# Patient Record
Sex: Male | Born: 1965 | Race: White | Hispanic: No | Marital: Married | Smoking: Never smoker
Health system: Southern US, Community
[De-identification: ages and names within clinical notes are randomized; demographics above are authoritative.]

## PROBLEM LIST (undated history)

## (undated) DIAGNOSIS — M549 Dorsalgia, unspecified: Secondary | ICD-10-CM

---

## 2009-11-13 ENCOUNTER — Ambulatory Visit: Payer: Self-pay | Admitting: Internal Medicine

## 2010-09-04 ENCOUNTER — Ambulatory Visit: Payer: Self-pay | Admitting: Internal Medicine

## 2015-08-15 ENCOUNTER — Ambulatory Visit: Payer: BC Managed Care – PPO | Admitting: Anesthesiology

## 2015-08-15 ENCOUNTER — Encounter
Admission: RE | Disposition: A | Discharge status: Home / Self Care | Payer: Self-pay | Source: Ambulatory Visit | Attending: Gastroenterology

## 2015-08-15 ENCOUNTER — Ambulatory Visit
Admission: RE | Admit: 2015-08-15 | Discharge: 2015-08-15 | Disposition: A | Discharge status: Home / Self Care | Payer: BC Managed Care – PPO | Source: Ambulatory Visit | Attending: Gastroenterology | Admitting: Gastroenterology

## 2015-08-15 ENCOUNTER — Encounter: Payer: Self-pay | Admitting: Anesthesiology

## 2015-08-15 DIAGNOSIS — D125 Benign neoplasm of sigmoid colon: Secondary | ICD-10-CM | POA: Insufficient documentation

## 2015-08-15 DIAGNOSIS — D124 Benign neoplasm of descending colon: Secondary | ICD-10-CM | POA: Insufficient documentation

## 2015-08-15 DIAGNOSIS — Z1211 Encounter for screening for malignant neoplasm of colon: Secondary | ICD-10-CM | POA: Insufficient documentation

## 2015-08-15 HISTORY — PX: COLONOSCOPY WITH PROPOFOL: SHX5780

## 2015-08-15 SURGERY — COLONOSCOPY WITH PROPOFOL
Anesthesia: General

## 2015-08-15 MED ORDER — SODIUM CHLORIDE 0.9 % IV SOLN: INTRAVENOUS | Status: DC

## 2015-08-15 MED ORDER — MIDAZOLAM HCL 5 MG/5ML IJ SOLN
INTRAMUSCULAR | Status: DC | PRN
  Administered 2015-08-15: 2 mg via INTRAVENOUS

## 2015-08-15 MED ORDER — SODIUM CHLORIDE 0.9 % IV SOLN
INTRAVENOUS | Status: DC
  Administered 2015-08-15: 1000 mL via INTRAVENOUS
  Administered 2015-08-15: 10:00:00 via INTRAVENOUS

## 2015-08-15 MED ORDER — PROPOFOL 500 MG/50ML IV EMUL
INTRAVENOUS | Status: DC | PRN
Start: 2015-08-15 — End: 2015-08-15
  Administered 2015-08-15: 160 ug/kg/min via INTRAVENOUS

## 2015-08-15 MED ORDER — PROPOFOL 10 MG/ML IV BOLUS
INTRAVENOUS | Status: DC | PRN
  Administered 2015-08-15: 70 mg via INTRAVENOUS

## 2015-08-15 NOTE — H&P (Signed)
Outpatient short stay form Pre-procedure 08/15/2015 10:14 AM Lollie Sails MD  Primary Physician: Dr. Emily Filbert  Reason for visit:  Screening colonoscopy  History of present illness:  Patient is a 50 year old male presenting for his initial screening colonoscopy. He tolerated his prep well. He takes no blood thinners or aspirin products.    Current facility-administered medications:  .  0.9 %  sodium chloride infusion, , Intravenous, Continuous, Lollie Sails, MD, Last Rate: 20 mL/hr at 08/15/15 0917, 1,000 mL at 08/15/15 0917 .  0.9 %  sodium chloride infusion, , Intravenous, Continuous, Lollie Sails, MD  No prescriptions prior to admission     Not on File   History reviewed. No pertinent past medical history.  Review of systems:      Physical Exam    Heart and lungs: Regular rate and rhythm without rub or gallop, lungs are bilaterally clear.    HEENT: Normocephalic atraumatic eyes are anicteric    Other:     Pertinant exam for procedure: Soft nontender nondistended bowel sounds positive normoactive.    Planned proceedures: Colonoscopy and indicated procedures. I have discussed the risks benefits and complications of procedures to include not limited to bleeding, infection, perforation and the risk of sedation and the patient wishes to proceed.    Lollie Sails, MD Gastroenterology 08/15/2015  10:14 AM

## 2015-08-15 NOTE — Anesthesia Procedure Notes (Signed)
Date/Time: 08/15/2015 10:15 AM Performed by: Allean Found Pre-anesthesia Checklist: Patient identified, Emergency Drugs available, Suction available, Patient being monitored and Timeout performed Patient Re-evaluated:Patient Re-evaluated prior to inductionOxygen Delivery Method: Nasal cannula Intubation Type: IV induction

## 2015-08-15 NOTE — Anesthesia Preprocedure Evaluation (Signed)
Anesthesia Evaluation  Patient identified by MRN, date of birth, ID band Patient awake    Reviewed: Allergy & Precautions, H&P , NPO status , Patient's Chart, lab work & pertinent test results, reviewed documented beta blocker date and time   History of Anesthesia Complications Negative for: history of anesthetic complications  Airway Mallampati: I  TM Distance: >3 FB Neck ROM: full    Dental no notable dental hx. (+) Caps   Pulmonary neg pulmonary ROS,    Pulmonary exam normal breath sounds clear to auscultation       Cardiovascular Exercise Tolerance: Good negative cardio ROS Normal cardiovascular exam Rhythm:regular Rate:Normal     Neuro/Psych negative neurological ROS  negative psych ROS   GI/Hepatic negative GI ROS, Neg liver ROS,   Endo/Other  negative endocrine ROS  Renal/GU negative Renal ROS  negative genitourinary   Musculoskeletal   Abdominal   Peds  Hematology negative hematology ROS (+)   Anesthesia Other Findings History reviewed. No pertinent past medical history.   Reproductive/Obstetrics negative OB ROS                             Anesthesia Physical Anesthesia Plan  ASA: I  Anesthesia Plan: General   Post-op Pain Management:    Induction:   Airway Management Planned:   Additional Equipment:   Intra-op Plan:   Post-operative Plan:   Informed Consent: I have reviewed the patients History and Physical, chart, labs and discussed the procedure including the risks, benefits and alternatives for the proposed anesthesia with the patient or authorized representative who has indicated his/her understanding and acceptance.   Dental Advisory Given  Plan Discussed with: Anesthesiologist, CRNA and Surgeon  Anesthesia Plan Comments:         Anesthesia Quick Evaluation

## 2015-08-15 NOTE — Op Note (Signed)
[facility] Gastroenterology Patient Name: John Lucas Procedure Date: 08/15/2015 10:20 AM MRN: [id number] Account #: [id number] Date of Birth: [birth year] Admit Type: Outpatient Age: 50 Room: [facility] ENDO ROOM 3 Gender: Male Note Status: Finalized Procedure:         Colonoscopy Indications:       Screening for colorectal malignant neoplasm, This is the                     patient's first colonoscopy Providers:         Lollie Sails, MD Referring MD:      Rusty Aus, MD (Referring MD) Medicines:         Monitored Anesthesia Care Complications:     No immediate complications. Procedure:         Pre-Anesthesia Assessment:                    - ASA Grade Assessment: I - A normal, healthy patient.                    After obtaining informed consent, the colonoscope was                     passed under direct vision. Throughout the procedure, the                     patient's blood pressure, pulse, and oxygen saturations                     were monitored continuously. The Colonoscope was                     introduced through the anus and advanced to the the cecum,                     identified by appendiceal orifice and ileocecal valve. The                     colonoscopy was performed without difficulty. The patient                     tolerated the procedure well. The quality of the bowel                     preparation was good. Findings:      Two sessile polyps were found in the descending colon. The polyps were 2       to 3 mm in size. [Method]. [Resection & Retrieval]. These polyps were       removed with a cold biopsy forceps. Resection and retrieval were       complete.      A 4 mm polyp was found in the proximal sigmoid colon. The polyp was       sessile. The polyp was removed with a cold snare. Resection and       retrieval were complete.      The retroflexed view of the distal rectum and anal verge was normal and       showed no anal or rectal  abnormalities.      The digital rectal exam was normal. Impression:        - Two 2 to 3 mm polyps in the descending colon. Resected  and retrieved.                    - One 4 mm polyp in the proximal sigmoid colon. Resected                     and retrieved.                    - The distal rectum and anal verge are normal on                     retroflexion view. Recommendation:    - Discharge patient to home. Procedure Code(s): --- Professional ---                    [phone], Colonoscopy, flexible; with removal of tumor(s),                     polyp(s), or other lesion(s) by snare technique                    45380, 54, Colonoscopy, flexible; with biopsy, single or                     multiple Diagnosis Code(s): --- Professional ---                    Z12.11, Encounter for screening for malignant neoplasm of                     colon                    D12.4, Benign neoplasm of descending colon                    D12.5, Benign neoplasm of sigmoid colon CPT copyright 2014 American Medical Association. All rights reserved. The codes documented in this report are preliminary and upon coder review may  be revised to meet current compliance requirements. Lollie Sails, MD 08/15/2015 10:46:06 AM This report has been signed electronically. Number of Addenda: 0 Note Initiated On: 08/15/2015 10:20 AM Scope Withdrawal Time: 0 hours 10 minutes 55 seconds  Total Procedure Duration: 0 hours 16 minutes 7 seconds       [facility]

## 2015-08-15 NOTE — Anesthesia Postprocedure Evaluation (Signed)
Anesthesia Post Note  Patient: John Lucas  Procedure(s) Performed: Procedure(s) (LRB): COLONOSCOPY WITH PROPOFOL (N/A)  Patient location during evaluation: Endoscopy Anesthesia Type: General Level of consciousness: awake and alert Pain management: pain level controlled Vital Signs Assessment: post-procedure vital signs reviewed and stable Respiratory status: spontaneous breathing, nonlabored ventilation, respiratory function stable and patient connected to nasal cannula oxygen Cardiovascular status: blood pressure returned to baseline and stable Postop Assessment: no signs of nausea or vomiting Anesthetic complications: no    Last Vitals:  Filed Vitals:   08/15/15 1110 08/15/15 1120  BP: 119/73 121/84  Pulse: 59 58  Temp:    Resp: 12 13    Last Pain: There were no vitals filed for this visit.               Martha Clan

## 2015-08-15 NOTE — Transfer of Care (Signed)
Immediate Anesthesia Transfer of Care Note  Patient: John Lucas  Procedure(s) Performed: Procedure(s): COLONOSCOPY WITH PROPOFOL (N/A)  Patient Location: PACU  Anesthesia Type:General  Level of Consciousness: sedated  Airway & Oxygen Therapy: Patient Spontanous Breathing and Patient connected to nasal cannula oxygen  Post-op Assessment: Report given to RN and Post -op Vital signs reviewed and stable  Post vital signs: Reviewed and stable  Last Vitals:  Filed Vitals:   08/15/15 0905  BP: 138/87  Pulse: 66  Temp: 36.4 C  Resp: 16    Complications: No apparent anesthesia complications

## 2015-08-18 LAB — SURGICAL PATHOLOGY

## 2016-03-17 ENCOUNTER — Ambulatory Visit
Admission: EM | Admit: 2016-03-17 | Discharge: 2016-03-17 | Disposition: A | Discharge status: Home / Self Care | Payer: BC Managed Care – PPO | Attending: Family Medicine | Admitting: Family Medicine

## 2016-03-17 ENCOUNTER — Encounter: Payer: Self-pay | Admitting: *Deleted

## 2016-03-17 DIAGNOSIS — M545 Low back pain, unspecified: Secondary | ICD-10-CM

## 2016-03-17 MED ORDER — NAPROXEN 500 MG PO TABS: 500.0000 mg | ORAL_TABLET | Freq: Two times a day (BID) | ORAL | 0 refills | Status: DC

## 2016-03-17 MED ORDER — CYCLOBENZAPRINE HCL 5 MG PO TABS: ORAL_TABLET | ORAL | 0 refills | Status: DC

## 2016-03-17 MED ORDER — KETOROLAC TROMETHAMINE 60 MG/2ML IM SOLN
60.0000 mg | Freq: Once | INTRAMUSCULAR | Status: AC
  Administered 2016-03-17: 60 mg via INTRAMUSCULAR

## 2016-03-17 NOTE — Discharge Instructions (Signed)
Medications prescribed, ice/heat when necessary, if symptoms persist or worsen seek medical attention. Avoid activities that cause increased discomfort.

## 2016-03-17 NOTE — ED Triage Notes (Signed)
Low back pain x2 days. Denies injury. Recurrent problem.

## 2016-03-17 NOTE — ED Provider Notes (Signed)
[facility]    CSN: [id number] Arrival date & time: 03/17/16  1508  First Provider Contact:  None       History   Chief Complaint Chief Complaint  Patient presents with  . Back Pain    HPI John Lucas is a 50 y.o. male.   HPI: Patient presents today with symptoms of right-sided lower back pain. Patient states that he stepped in a hole while he was walking in the woods on Monday when the symptoms started. He feels like his back is spasming. He denies any other injury to his back or falling. He denies any radicular symptoms, incontinence, fever/chills, urinary symptoms. He has had lower back pain in the past but has never been diagnosed with any issues. The pain would just go away with time. He does not have any chronic medical problems. He has not taken any medications for his symptoms today. He has been using heat on the area.  History reviewed. No pertinent past medical history.  There are no active problems to display for this patient.   Past Surgical History:  Procedure Laterality Date  . COLONOSCOPY WITH PROPOFOL N/A 08/15/2015   Procedure: COLONOSCOPY WITH PROPOFOL;  Surgeon: Lollie Sails, MD;  Location: [facility] ENDOSCOPY;  Service: Endoscopy;  Laterality: N/A;       Home Medications    Prior to Admission medications   Medication Sig Start Date End Date Taking? Authorizing Provider  aspirin EC 81 MG tablet Take 81 mg by mouth daily.   Yes Historical Provider, MD    Family History History reviewed. No pertinent family history.  Social History Social History  Substance Use Topics  . Smoking status: Never Smoker  . Smokeless tobacco: Never Used  . Alcohol use No     Allergies   Review of patient's allergies indicates no known allergies.   Review of Systems Review of Systems: Negative except mentioned.    Physical Exam Triage Vital Signs ED Triage Vitals  Enc Vitals Group     BP 03/17/16 1526 (!) 159/97     Pulse Rate 03/17/16 1526  81     Resp 03/17/16 1526 16     Temp 03/17/16 1526 98.2 F (36.8 C)     Temp Source 03/17/16 1526 Oral     SpO2 03/17/16 1526 98 %     Weight 03/17/16 1528 215 lb (97.5 kg)     Height 03/17/16 1528 6\' 1"  (1.854 m)     Head Circumference --      Peak Flow --      Pain Score 03/17/16 1531 9     Pain Loc --      Pain Edu? --      Excl. in [place]? --    No data found.   Updated Vital Signs BP (!) 159/97 (BP Location: Left Arm)   Pulse 81   Temp 98.2 F (36.8 C) (Oral)   Resp 16   Ht 6\' 1"  (1.854 m)   Wt 215 lb (97.5 kg)   SpO2 98%   BMI 28.37 kg/m      Physical Exam:  GENERAL: NAD RESP: CTA B CARD: RRR MSK: no midline tenderness, mild paravertebral tenderness along right L4-L5, decreased flexion and extension, mild spasm appreciated, 5/5 strength of LEs, -SLR, nv intact  NEURO: CN II-XII grossly intact     UC Treatments / Results  Labs (all labs ordered are listed, but only abnormal results are displayed) Labs Reviewed - No data to  display  EKG  EKG Interpretation None       Radiology No results found.  Procedures Procedures (including critical care time)  Medications Ordered in UC Medications - No data to display   Initial Impression / Assessment and Plan / UC Course  I have reviewed the triage vital signs and the nursing notes.  Pertinent labs & imaging results that were available during my care of the patient were reviewed by me and considered in my medical decision making (see chart for details).  Clinical Course   A/P: Right lower back pain without radicular symptoms - patient given Toradol 60 mg IM in the office today with some relief, will treat patient with Naprosyn, Flexeril when necessary, ice/heat when necessary, follow-up with primary care physician if symptoms persist or worsen as discussed, would suggest imaging if patient's symptoms do persist or worsen. Patient addresses understanding of plan.  Final Clinical Impressions(s) / UC  Diagnoses   Final diagnoses:  None    New Prescriptions New Prescriptions   No medications on file     Paulina Fusi, MD 03/17/16 1659

## 2017-01-01 ENCOUNTER — Encounter: Payer: Self-pay | Admitting: Gynecology

## 2017-01-01 ENCOUNTER — Ambulatory Visit
Admission: EM | Admit: 2017-01-01 | Discharge: 2017-01-01 | Disposition: A | Discharge status: Home / Self Care | Payer: BC Managed Care – PPO | Attending: Emergency Medicine | Admitting: Emergency Medicine

## 2017-01-01 DIAGNOSIS — M545 Low back pain, unspecified: Secondary | ICD-10-CM

## 2017-01-01 HISTORY — DX: Dorsalgia, unspecified: M54.9

## 2017-01-01 MED ORDER — CYCLOBENZAPRINE HCL 10 MG PO TABS: 10.0000 mg | ORAL_TABLET | Freq: Two times a day (BID) | ORAL | 0 refills | Status: AC | PRN

## 2017-01-01 MED ORDER — NAPROXEN 500 MG PO TABS: 500.0000 mg | ORAL_TABLET | Freq: Two times a day (BID) | ORAL | 0 refills | Status: AC

## 2017-01-01 MED ORDER — KETOROLAC TROMETHAMINE 60 MG/2ML IM SOLN
30.0000 mg | Freq: Once | INTRAMUSCULAR | Status: AC
  Administered 2017-01-01: 30 mg via INTRAMUSCULAR

## 2017-01-01 NOTE — ED Provider Notes (Signed)
[facility] ____________________________________________  Time seen: Approximately 4:11 PM  I have reviewed the triage vital signs and the nursing notes.   HISTORY  Chief Complaint Back Pain   HPI John Lucas is a 51 y.o. male presenting with wife at bedside for the complaints of low back pain has been present since Monday of this week. 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 Patient reports he noticed his back pain Monday after working. Patient states that he believes he may have turned the wrong way aggravating his pain. Patient reports he has a history of the same pain in the past. Patient denies any fall or direct trauma. Denies any abrupt onset with heavy lifting or any other trigger. Patient reports pain is primarily with movement. Reports if he sits completely still he has no pain but pain is present with twisting and bending. Patient describes the pain as a catching muscle spasm. Denies any urinary or bowel retention or incontinence, paresthesias, weakness, unilateral pain. Again denies fall or direct trauma. Reports seen as previous back pain. Has taken some over-the-counter ibuprofen, no other medications taken at home over-the-counter. Reports applying some ice also has helped some. Reports rest helps some. Reports pain right now is mild. Reports otherwise feels well. Denies fevers.  Denies chest pain, shortness of breath, abdominal pain, dysuria, extremity pain, extremity swelling or rash. Denies recent sickness. Denies recent antibiotic use.    Past Medical History:  Diagnosis Date  . Back pain     There are no active problems to display for this patient.   Past Surgical History:  Procedure Laterality Date  . COLONOSCOPY WITH PROPOFOL N/A 08/15/2015   Procedure: COLONOSCOPY WITH PROPOFOL;  Surgeon: Lollie Sails, MD;  Location: [facility] ENDOSCOPY;  Service: Endoscopy;  Laterality: N/A;     No current facility-administered medications  for this encounter.   Current Outpatient Prescriptions:  .  aspirin EC 81 MG tablet, Take 81 mg by mouth daily., Disp: , Rfl:  .  cyclobenzaprine (FLEXERIL) 10 MG tablet, Take 1 tablet (10 mg total) by mouth 2 (two) times daily as needed for muscle spasms. Do not drive while taking as can cause drowsiness, Disp: 15 tablet, Rfl: 0 .  naproxen (NAPROSYN) 500 MG tablet, Take 1 tablet (500 mg total) by mouth 2 (two) times daily., Disp: 14 tablet, Rfl: 0  Allergies Patient has no known allergies.  No family history on file.  Social History Social History  Substance Use Topics  . Smoking status: Never Smoker  . Smokeless tobacco: Never Used  . Alcohol use No    Review of Systems Constitutional: No fever/chills Cardiovascular: Denies chest pain. Respiratory: Denies shortness of breath. Gastrointestinal: No abdominal pain.   Genitourinary: Negative for dysuria. Musculoskeletal: Positive for back pain. Skin: Negative for rash.  ____________________________________________   PHYSICAL EXAM:  VITAL SIGNS: ED Triage Vitals  Enc Vitals Group     BP 01/01/17 1450 138/87     Pulse Rate 01/01/17 1450 63     Resp 01/01/17 1450 16     Temp 01/01/17 1450 98 F (36.7 C)     Temp Source 01/01/17 1450 Oral     SpO2 01/01/17 1450 98 %     Weight 01/01/17 1451 220 lb (99.8 kg)     Height 01/01/17 1451 6\' 1"  (1.854 m)     Head Circumference --      Peak Flow --      Pain Score 01/01/17 1451 8  Pain Loc --      Pain Edu? --      Excl. in [place]? --     Constitutional: Alert and oriented. Well appearing and in no acute distress. Cardiovascular: Normal rate, regular rhythm. Grossly normal heart sounds.  Good peripheral circulation. Respiratory: Normal respiratory effort without tachypnea nor retractions. Breath sounds are clear and equal bilaterally. No wheezes, rales, rhonchi. Gastrointestinal: Soft and nontender. No CVA tenderness. Musculoskeletal:  Nontender with normal range of motion  in all extremities. No midline cervical, thoracic or lumbar tenderness to palpation. Bilateral pedal pulses equal and easily palpated. Ambulatory with steady gait. Changes positions quickly, no midline tenderness, no pain by direct palpation, pain in the L3-L5 area with lumbar flexion and extension, no pain with lumbar rotation, negative bilateral straight leg raises, bilateral plantar flexion and dorsiflexion strong and equal, no saddle anesthesia, steady gait. Neurologic:  Normal speech and language. No gross focal neurologic deficits are appreciated. Speech is normal. No gait instability.  Skin:  Skin is warm, dry and intact. No rash noted.  RADIOLOGY  As noted trauma or direct injury and no midline tenderness, will defer x-ray, patient agrees to this.  PROCEDURES Procedures     INITIAL IMPRESSION / ASSESSMENT AND PLAN / ED COURSE  Pertinent labs & imaging results that were available during my care of the patient were reviewed by me and considered in my medical decision making (see chart for details).  Very well-appearing patient. No acute distress.Presents for evaluation of low back pain with history of similar. Denies direct injury or trauma. No midline tenderness. Suspect muscular strain and inflammatory pain. Patient reports in past he was treated well with naproxen and Flexeril. 30 mg IM Toradol given once in urgent care. Will treat patient home with naproxen and when necessary Flexeril. Encourage rest, fluids and supportive care. Discussed stretching and avoidance of aggravating activities.Discussed indication, risks and benefits of medications with patient.  Discussed follow up with Primary care physician this week. Discussed follow up and return parameters including no resolution or any worsening concerns. Patient verbalized understanding and agreed to plan.   ____________________________________________    FINAL CLINICAL IMPRESSION(S) / ED DIAGNOSES  Final diagnoses:  Acute  bilateral low back pain without sciatica     Discharge Medication List as of 01/01/2017  4:15 PM      Note: This dictation was prepared with Dragon dictation along with smaller phrase technology. Any transcriptional errors that result from this process are unintentional.         Marylene Land, NP 01/01/17 1806

## 2017-01-01 NOTE — Discharge Instructions (Signed)
Take medication as prescribed. Rest. Drink plenty of fluids. Stretch. Use good body mechanics.  Follow up with your primary care physician this week as needed. Return to Urgent care for new or worsening concerns.

## 2017-01-01 NOTE — ED Triage Notes (Signed)
Patient c/o low back pain / spasm on and off. Per patient was see in 2017 for the same problem.

## 2018-10-08 ENCOUNTER — Telehealth: Payer: BC Managed Care – PPO | Admitting: Family

## 2018-10-08 DIAGNOSIS — L237 Allergic contact dermatitis due to plants, except food: Secondary | ICD-10-CM

## 2018-10-08 MED ORDER — PREDNISONE 10 MG (21) PO TBPK: ORAL_TABLET | ORAL | 0 refills | Status: AC

## 2018-10-08 NOTE — Progress Notes (Signed)

## 2019-10-12 ENCOUNTER — Other Ambulatory Visit: Payer: Self-pay

## 2019-10-12 ENCOUNTER — Ambulatory Visit: Payer: BC Managed Care – PPO | Attending: Internal Medicine

## 2019-10-12 DIAGNOSIS — Z23 Encounter for immunization: Secondary | ICD-10-CM

## 2019-10-12 NOTE — Progress Notes (Signed)
   Covid-19 Vaccination Clinic  Name:  John Lucas    MRN: [id number] DOB: 1966-05-08  10/12/2019  [name] was observed post Covid-19 immunization for 15 minutes without incident. He was provided with Vaccine Information Sheet and instruction to access the V-Safe system.   [name] was instructed to call 911 with any severe reactions post vaccine: Marland Kitchen Difficulty breathing  . Swelling of face and throat  . A fast heartbeat  . A bad rash all over body  . Dizziness and weakness   Immunizations Administered    Name Date Dose VIS Date Route   Pfizer COVID-19 Vaccine 10/12/2019  8:59 AM 0.3 mL 06/22/2019 Intramuscular   Manufacturer: [place]   Lot: [id number]   [place]: [id number]

## 2019-11-06 ENCOUNTER — Ambulatory Visit: Payer: BC Managed Care – PPO | Attending: Internal Medicine

## 2019-11-06 DIAGNOSIS — Z23 Encounter for immunization: Secondary | ICD-10-CM

## 2019-11-06 NOTE — Progress Notes (Signed)
   Covid-19 Vaccination Clinic  Name:  John Lucas    MRN: [id number] DOB: 1966-04-11  11/06/2019  [name] was observed post Covid-19 immunization for 15 minutes without incident. He was provided with Vaccine Information Sheet and instruction to access the V-Safe system.   [name] was instructed to call 911 with any severe reactions post vaccine: Marland Kitchen Difficulty breathing  . Swelling of face and throat  . A fast heartbeat  . A bad rash all over body  . Dizziness and weakness   Immunizations Administered    Name Date Dose VIS Date Route   Pfizer COVID-19 Vaccine 11/06/2019  3:59 PM 0.3 mL 09/05/2018 Intramuscular   Manufacturer: [place]   Lot: [id number]   [place]: [id number]

## 2019-12-24 ENCOUNTER — Other Ambulatory Visit: Payer: Self-pay | Admitting: Physical Medicine & Rehabilitation

## 2019-12-24 DIAGNOSIS — M5416 Radiculopathy, lumbar region: Secondary | ICD-10-CM

## 2020-01-03 ENCOUNTER — Ambulatory Visit
Admission: RE | Admit: 2020-01-03 | Discharge: 2020-01-03 | Disposition: A | Discharge status: Home / Self Care | Payer: BC Managed Care – PPO | Source: Ambulatory Visit | Attending: Physical Medicine & Rehabilitation | Admitting: Physical Medicine & Rehabilitation

## 2020-01-03 ENCOUNTER — Other Ambulatory Visit: Payer: Self-pay

## 2020-01-03 DIAGNOSIS — M5416 Radiculopathy, lumbar region: Secondary | ICD-10-CM | POA: Diagnosis not present

## 2021-06-12 ENCOUNTER — Encounter: Payer: Self-pay | Admitting: *Deleted

## 2021-06-12 ENCOUNTER — Ambulatory Visit: Payer: BC Managed Care – PPO | Admitting: Registered Nurse

## 2021-06-12 ENCOUNTER — Ambulatory Visit
Admission: RE | Admit: 2021-06-12 | Discharge: 2021-06-12 | Disposition: A | Discharge status: Home / Self Care | Payer: BC Managed Care – PPO | Attending: Gastroenterology | Admitting: Gastroenterology

## 2021-06-12 ENCOUNTER — Encounter
Admission: RE | Disposition: A | Discharge status: Home / Self Care | Payer: Self-pay | Source: Home / Self Care | Attending: Gastroenterology

## 2021-06-12 DIAGNOSIS — K640 First degree hemorrhoids: Secondary | ICD-10-CM | POA: Diagnosis not present

## 2021-06-12 DIAGNOSIS — K644 Residual hemorrhoidal skin tags: Secondary | ICD-10-CM | POA: Diagnosis not present

## 2021-06-12 DIAGNOSIS — K621 Rectal polyp: Secondary | ICD-10-CM | POA: Diagnosis not present

## 2021-06-12 DIAGNOSIS — Z1211 Encounter for screening for malignant neoplasm of colon: Secondary | ICD-10-CM | POA: Insufficient documentation

## 2021-06-12 HISTORY — PX: COLONOSCOPY WITH PROPOFOL: SHX5780

## 2021-06-12 SURGERY — COLONOSCOPY WITH PROPOFOL
Anesthesia: General

## 2021-06-12 MED ORDER — PROPOFOL 500 MG/50ML IV EMUL
INTRAVENOUS | Status: AC
  Filled 2021-06-12: qty 50

## 2021-06-12 MED ORDER — EPHEDRINE SULFATE 50 MG/ML IJ SOLN
INTRAMUSCULAR | Status: DC | PRN
  Administered 2021-06-12 (×2): 10 mg via INTRAVENOUS

## 2021-06-12 MED ORDER — LIDOCAINE HCL (PF) 2 % IJ SOLN
INTRAMUSCULAR | Status: AC
  Filled 2021-06-12: qty 5

## 2021-06-12 MED ORDER — PROPOFOL 10 MG/ML IV BOLUS
INTRAVENOUS | Status: DC | PRN
  Administered 2021-06-12: 90 mg via INTRAVENOUS

## 2021-06-12 MED ORDER — LIDOCAINE HCL (CARDIAC) PF 100 MG/5ML IV SOSY
PREFILLED_SYRINGE | INTRAVENOUS | Status: DC | PRN
  Administered 2021-06-12: 40 mg via INTRAVENOUS

## 2021-06-12 MED ORDER — EPHEDRINE 5 MG/ML INJ
INTRAVENOUS | Status: AC
  Filled 2021-06-12: qty 5

## 2021-06-12 MED ORDER — PROPOFOL 500 MG/50ML IV EMUL
INTRAVENOUS | Status: DC | PRN
Start: 2021-06-12 — End: 2021-06-12
  Administered 2021-06-12: 150 ug/kg/min via INTRAVENOUS

## 2021-06-12 MED ORDER — SODIUM CHLORIDE 0.9 % IV SOLN
INTRAVENOUS | Status: DC
  Administered 2021-06-12: 20 mL/h via INTRAVENOUS

## 2021-06-12 NOTE — Transfer of Care (Signed)
Immediate Anesthesia Transfer of Care Note  Patient: John Lucas  Procedure(s) Performed: COLONOSCOPY WITH PROPOFOL  Patient Location: PACU and Endoscopy Unit  Anesthesia Type:General  Level of Consciousness: drowsy  Airway & Oxygen Therapy: Patient Spontanous Breathing  Post-op Assessment: Report given to RN and Post -op Vital signs reviewed and stable  Post vital signs: Reviewed and stable  Last Vitals:  Vitals Value Taken Time  BP 102/78 06/12/21 1012  Temp    Pulse 68 06/12/21 1014  Resp 21 06/12/21 1014  SpO2 97 % 06/12/21 1014  Vitals shown include unvalidated device data.  Last Pain:  Vitals:   06/12/21 1011  TempSrc:   PainSc: 0-No pain         Complications: No notable events documented.

## 2021-06-12 NOTE — Anesthesia Postprocedure Evaluation (Signed)
Anesthesia Post Note  Patient: John Lucas  Procedure(s) Performed: COLONOSCOPY WITH PROPOFOL  Patient location during evaluation: PACU Anesthesia Type: General Level of consciousness: awake and awake and alert Pain management: pain level not controlled Vital Signs Assessment: post-procedure vital signs reviewed and stable Respiratory status: spontaneous breathing and nonlabored ventilation Cardiovascular status: stable Anesthetic complications: no   No notable events documented.   Last Vitals:  Vitals:   06/12/21 1011 06/12/21 1033  BP:    Pulse:    Resp: 16   Temp:  (!) 35.6 C  SpO2:      Last Pain:  Vitals:   06/12/21 1033  TempSrc: Temporal  PainSc: 0-No pain                 VAN STAVEREN,Demarrius Guerrero

## 2021-06-12 NOTE — H&P (Signed)
Pre-Procedure H&P   Patient ID: John Lucas is a 55 y.o. male.  Gastroenterology Provider: Annamaria Helling, DO  Referring Provider: Dr. Sabra Heck PCP: Rusty Aus, MD  Date: 06/12/2021  HPI Mr. John Lucas is a 55 y.o. male who presents today for Colonoscopy for personal history of colon polyps- surveillance.  Normal bm w/o blood, melena, diarrhea, or constipation  No other acute gi complaints.  08/2015 Colonoscopy- two tubular adenomas. No fhx crc or colon polyps.   Past Medical History:  Diagnosis Date   Back pain     Past Surgical History:  Procedure Laterality Date   COLONOSCOPY WITH PROPOFOL N/A 08/15/2015   Procedure: COLONOSCOPY WITH PROPOFOL;  Surgeon: Lollie Sails, MD;  Location: [facility] ENDOSCOPY;  Service: Endoscopy;  Laterality: N/A;    Family History No h/o GI disease or malignancy  Review of Systems  Constitutional:  Negative for activity change, appetite change, chills, diaphoresis, fatigue, fever and unexpected weight change.  HENT:  Negative for trouble swallowing and voice change.   Respiratory:  Negative for shortness of breath and wheezing.   Cardiovascular:  Negative for chest pain, palpitations and leg swelling.  Gastrointestinal:  Negative for abdominal distention, abdominal pain, anal bleeding, blood in stool, constipation, diarrhea, nausea and vomiting.  Musculoskeletal:  Negative for arthralgias and myalgias.  Skin:  Negative for color change and pallor.  Neurological:  Negative for dizziness, syncope and weakness.  Psychiatric/Behavioral:  Negative for confusion. The patient is not nervous/anxious.   All other systems reviewed and are negative.   Medications No current facility-administered medications on file prior to encounter.   Current Outpatient Medications on File Prior to Encounter  Medication Sig Dispense Refill   amLODipine (NORVASC) 5 MG tablet Take 5 mg by mouth daily.     aspirin EC 81 MG tablet Take 81 mg by  mouth daily.     cyclobenzaprine (FLEXERIL) 10 MG tablet Take 1 tablet (10 mg total) by mouth 2 (two) times daily as needed for muscle spasms. Do not drive while taking as can cause drowsiness 15 tablet 0   Multiple Vitamin (MULTIVITAMIN) tablet Take 1 tablet by mouth daily.     predniSONE (STERAPRED UNI-PAK 21 TAB) 10 MG (21) TBPK tablet As directed 21 tablet 0   vitamin B-12 (CYANOCOBALAMIN) 1000 MCG tablet Take 1,000 mcg by mouth daily.      Pertinent medications related to GI and procedure were reviewed by me with the patient prior to the procedure   Current Facility-Administered Medications:    0.9 %  sodium chloride infusion, , Intravenous, Continuous, Annamaria Helling, DO, Last Rate: 20 mL/hr at 06/12/21 0901, 20 mL/hr at 06/12/21 0901  sodium chloride 20 mL/hr (06/12/21 0901)       No Known Allergies Allergies were reviewed by me prior to the procedure  Objective    Vitals:   06/12/21 0848  BP: 115/84  Pulse: 71  Resp: 20  Temp: (!) 96.6 F (35.9 C)  TempSrc: Temporal  SpO2: (!) 9%  Weight: 95.3 kg  Height: 6\' 1"  (1.854 m)     Physical Exam Vitals and nursing note reviewed.  Constitutional:      General: He is not in acute distress.    Appearance: Normal appearance. He is not ill-appearing, toxic-appearing or diaphoretic.  HENT:     Head: Normocephalic and atraumatic.     Nose: Nose normal.     Mouth/Throat:     Mouth: Mucous membranes are moist.  Pharynx: Oropharynx is clear.  Eyes:     General: No scleral icterus.    Extraocular Movements: Extraocular movements intact.  Cardiovascular:     Rate and Rhythm: Normal rate and regular rhythm.     Heart sounds: Normal heart sounds. No murmur heard.   No friction rub. No gallop.  Pulmonary:     Effort: Pulmonary effort is normal. No respiratory distress.     Breath sounds: Normal breath sounds. No wheezing, rhonchi or rales.  Abdominal:     General: Bowel sounds are normal. There is no distension.      Palpations: Abdomen is soft.     Tenderness: There is no abdominal tenderness. There is no guarding or rebound.  Musculoskeletal:     Cervical back: Neck supple.     Right lower leg: No edema.     Left lower leg: No edema.  Skin:    General: Skin is warm and dry.     Coloration: Skin is not jaundiced or pale.  Neurological:     General: No focal deficit present.     Mental Status: He is alert and oriented to person, place, and time. Mental status is at baseline.  Psychiatric:        Mood and Affect: Mood normal.        Behavior: Behavior normal.        Thought Content: Thought content normal.        Judgment: Judgment normal.     Assessment:  Mr. John Lucas is a 55 y.o. male  who presents today for Colonoscopy for personal history of colon polyps- surveillance.  Plan:  Colonoscopy with possible intervention today  Colonoscopy with possible biopsy, control of bleeding, polypectomy, and interventions as necessary has been discussed with the patient/patient representative. Informed consent was obtained from the patient/patient representative after explaining the indication, nature, and risks of the procedure including but not limited to death, bleeding, perforation, missed neoplasm/lesions, cardiorespiratory compromise, and reaction to medications. Opportunity for questions was given and appropriate answers were provided. Patient/patient representative has verbalized understanding is amenable to undergoing the procedure.   Annamaria Helling, DO  [facility]  Portions of the record may have been created with voice recognition software. Occasional wrong-word or 'sound-a-like' substitutions may have occurred due to the inherent limitations of voice recognition software.  Read the chart carefully and recognize, using context, where substitutions may have occurred.

## 2021-06-12 NOTE — Op Note (Signed)
[facility] Patient Name: John Lucas Procedure Date: 06/12/2021 9:24 AM MRN: [id number] Account #: [id number] Date of Birth: 13-Jun-1966 Admit Type: Outpatient Age: 55 Room: [facility] ENDO ROOM 1 Gender: Male Note Status: Finalized Instrument Name: Colonoscope [id number] Procedure:             Colonoscopy Indications:           Screening for colorectal malignant neoplasm Providers:             Annamaria Helling DO, DO Referring MD:          Rusty Aus, MD (Referring MD) Medicines:             Monitored Anesthesia Care Complications:         No immediate complications. Estimated blood loss:                         Minimal. Procedure:             Pre-Anesthesia Assessment:                        - Prior to the procedure, a History and Physical was                         performed, and patient medications and allergies were                         reviewed. The patient is competent. The risks and                         benefits of the procedure and the sedation options and                         risks were discussed with the patient. All questions                         were answered and informed consent was obtained.                         Patient identification and proposed procedure were                         verified by the physician, the nurse, the anesthetist                         and the technician in the endoscopy suite. Mental                         Status Examination: alert and oriented. Airway                         Examination: normal oropharyngeal airway and neck                         mobility. Respiratory Examination: clear to                         auscultation. CV Examination: RRR, no murmurs, no S3  or S4. Prophylactic Antibiotics: The patient does not                         require prophylactic antibiotics. Prior                         Anticoagulants: The patient has taken no previous                          anticoagulant or antiplatelet agents. ASA Grade                         Assessment: II - A patient with mild systemic disease.                         After reviewing the risks and benefits, the patient                         was deemed in satisfactory condition to undergo the                         procedure. The anesthesia plan was to use monitored                         anesthesia care (MAC). Immediately prior to                         administration of medications, the patient was                         re-assessed for adequacy to receive sedatives. The                         heart rate, respiratory rate, oxygen saturations,                         blood pressure, adequacy of pulmonary ventilation, and                         response to care were monitored throughout the                         procedure. The physical status of the patient was                         re-assessed after the procedure.                        After obtaining informed consent, the colonoscope was                         passed under direct vision. Throughout the procedure,                         the patient's blood pressure, pulse, and oxygen                         saturations were monitored continuously. The  Colonoscope was introduced through the anus and                         advanced to the the terminal ileum, with                         identification of the appendiceal orifice and IC                         valve. The colonoscopy was performed without                         difficulty. The patient tolerated the procedure well.                         The quality of the bowel preparation was evaluated                         using the BBPS [facility] Bowel Preparation Scale) with                         scores of: Right Colon = 3, Transverse Colon = 3 and                         Left Colon = 3 (entire mucosa seen well with no                          residual staining, small fragments of stool or opaque                         liquid). The total BBPS score equals 9. The ileocecal                         valve, appendiceal orifice, and rectum were                         photographed. Findings:      Skin tags were found on perianal exam.      The digital rectal exam was normal. Pertinent negatives include normal       sphincter tone.      A 1 to 2 mm polyp was found in the recto-sigmoid colon. The polyp was       sessile. The polyp was removed with a cold biopsy forceps. Resection and       retrieval were complete. Estimated blood loss was minimal.      Non-bleeding internal hemorrhoids were found during retroflexion. The       hemorrhoids were Grade I (internal hemorrhoids that do not prolapse).      The exam was otherwise without abnormality on direct and retroflexion       views.      The terminal ileum appeared normal. Estimated blood loss: none. Impression:            - Perianal skin tags found on perianal exam.                        - One 1 to 2 mm polyp at the recto-sigmoid colon,  removed with a cold biopsy forceps. Resected and                         retrieved.                        - Non-bleeding internal hemorrhoids.                        - The examination was otherwise normal on direct and                         retroflexion views.                        - The examined portion of the ileum was normal. Recommendation:        - Discharge patient to home.                        - Resume previous diet.                        - Continue present medications.                        - Await pathology results.                        - Repeat colonoscopy for surveillance based on                         pathology results.                        - Return to referring physician as previously                         scheduled. Procedure Code(s):     --- Professional ---                        [phone],  Colonoscopy, flexible; with biopsy, single or                         multiple Diagnosis Code(s):     --- Professional ---                        Z12.11, Encounter for screening for malignant neoplasm                         of colon                        K63.5, Polyp of colon                        K64.0, First degree hemorrhoids                        K64.4, Residual hemorrhoidal skin tags CPT copyright 2019 [employer]. All rights reserved. The codes documented in this report are preliminary and upon coder review may  be revised to meet current compliance requirements. Attending Participation:  I personally performed the entire procedure. Volney American, DO Annamaria Helling DO, DO 06/12/2021 10:10:38 AM This report has been signed electronically. Number of Addenda: 0 Note Initiated On: 06/12/2021 9:24 AM Scope Withdrawal Time: 0 hours 19 minutes 17 seconds  Total Procedure Duration: 0 hours 26 minutes 34 seconds  Estimated Blood Loss:  Estimated blood loss was minimal.      [facility]

## 2021-06-12 NOTE — Interval H&P Note (Signed)
History and Physical Interval Note: Preprocedure H&P from 06/12/21  was reviewed and there was no interval change after seeing and examining the patient.  Written consent was obtained from the patient after discussion of risks, benefits, and alternatives. Patient has consented to proceed with Colonoscopy with possible intervention   06/12/2021 9:33 AM  John Lucas  has presented today for surgery, with the diagnosis of Screening.  The various methods of treatment have been discussed with the patient and family. After consideration of risks, benefits and other options for treatment, the patient has consented to  Procedure(s): COLONOSCOPY WITH PROPOFOL (N/A) as a surgical intervention.  The patient's history has been reviewed, patient examined, no change in status, stable for surgery.  I have reviewed the patient's chart and labs.  Questions were answered to the patient's satisfaction.     Annamaria Helling

## 2021-06-12 NOTE — Anesthesia Preprocedure Evaluation (Signed)
Anesthesia Evaluation  Patient identified by MRN, date of birth, ID band Patient awake    Reviewed: Allergy & Precautions, NPO status , Patient's Chart, lab work & pertinent test results  Airway Mallampati: II  TM Distance: >3 FB Neck ROM: Full    Dental  (+) Teeth Intact   Pulmonary neg pulmonary ROS,    Pulmonary exam normal breath sounds clear to auscultation       Cardiovascular hypertension, Pt. on medications negative cardio ROS Normal cardiovascular exam Rhythm:Regular     Neuro/Psych negative neurological ROS  negative psych ROS   GI/Hepatic negative GI ROS, Neg liver ROS,   Endo/Other  negative endocrine ROS  Renal/GU negative Renal ROS  negative genitourinary   Musculoskeletal negative musculoskeletal ROS (+)   Abdominal Normal abdominal exam  (+)   Peds negative pediatric ROS (+)  Hematology negative hematology ROS (+)   Anesthesia Other Findings Past Medical History: No date: Back pain  Past Surgical History: 08/15/2015: COLONOSCOPY WITH PROPOFOL; N/A     Comment:  Procedure: COLONOSCOPY WITH PROPOFOL;  Surgeon: Lollie Sails, MD;  Location: [facility] ENDOSCOPY;  Service:               Endoscopy;  Laterality: N/A;  BMI    Body Mass Index: 27.71 kg/m      Reproductive/Obstetrics negative OB ROS                             Anesthesia Physical Anesthesia Plan  ASA: 2  Anesthesia Plan: General   Post-op Pain Management:    Induction: Intravenous  PONV Risk Score and Plan: Propofol infusion and TIVA  Airway Management Planned: Natural Airway and Nasal Cannula  Additional Equipment:   Intra-op Plan:   Post-operative Plan:   Informed Consent: I have reviewed the patients History and Physical, chart, labs and discussed the procedure including the risks, benefits and alternatives for the proposed anesthesia with the patient or authorized  representative who has indicated his/her understanding and acceptance.     Dental Advisory Given  Plan Discussed with: Anesthesiologist, CRNA and Surgeon  Anesthesia Plan Comments:         Anesthesia Quick Evaluation

## 2021-06-15 ENCOUNTER — Encounter: Payer: Self-pay | Admitting: Gastroenterology

## 2021-06-15 LAB — SURGICAL PATHOLOGY

## 2021-06-26 IMAGING — MR MR LUMBAR SPINE W/O CM
5 series · 31 of 48 positions shown · non-contrast
Comparison: None.

CLINICAL DATA: Low back pain and occasional numbness in both feet
for 7-8 years. No known injury.

EXAM:
MRI LUMBAR SPINE WITHOUT CONTRAST
TECHNIQUE: Multiplanar, multisequence MR imaging of the lumbar spine was
performed. No intravenous contrast was administered.

[Series 5: T2 · sagittal · 4.0mm · 0.81mm/px · 6 of 17 slices shown (1 of 2)]
[im 1/17]
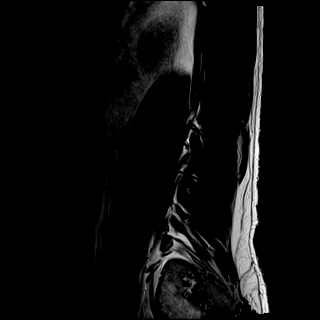
[im 4/17]
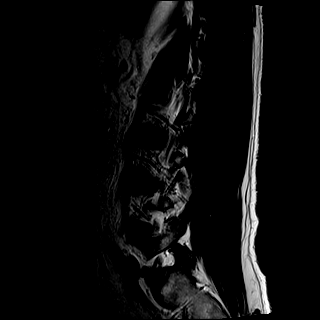
[im 7/17]
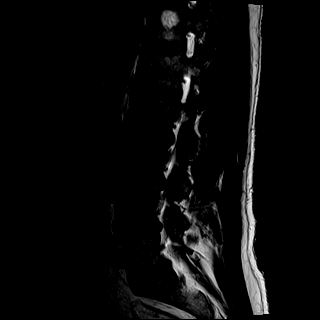
[im 10/17]
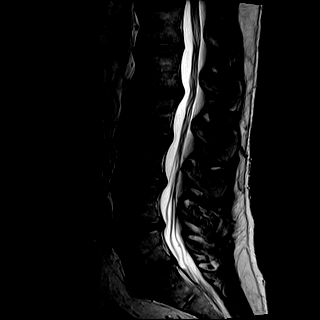
[im 13/17]
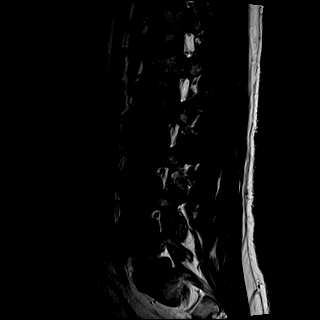
[im 17/17]
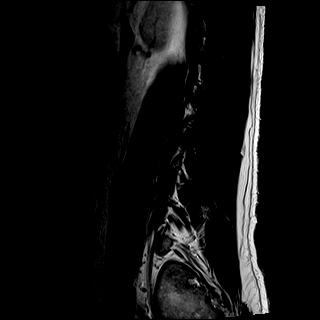

[Series 6: T1 · sagittal · 4.0mm · 0.81mm/px · 6 of 17 slices shown (1 of 2)]
[im 1/17]
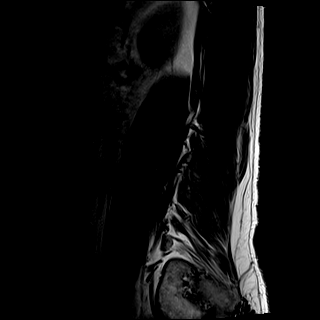
[im 4/17]
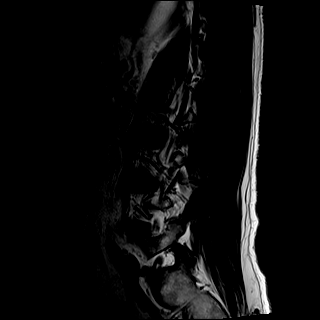
[im 7/17]
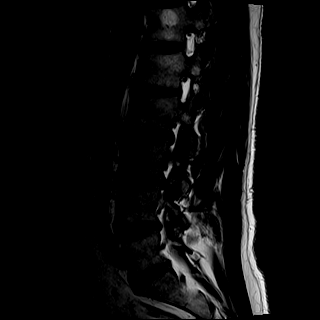
[im 10/17]
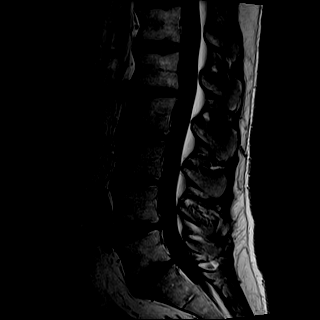
[im 13/17]
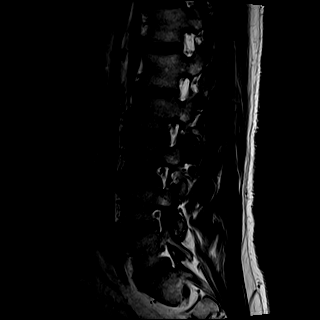
[im 17/17]
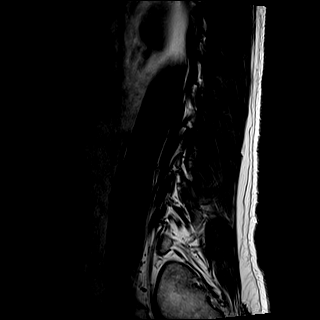

[Series 7: STIR · sagittal · 4.0mm · 0.41mm/px · 1 of 17 slices shown]
[im 1/17]
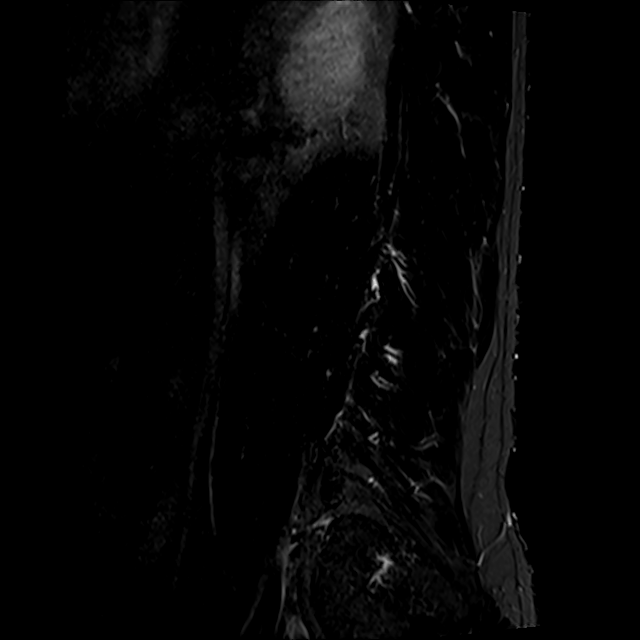

[Series 8: T2 · axial · 4.0mm · 0.78mm/px · z∈[-181,+58]mm · 9 of 42 slices shown (2 of 2)]
[im 1/42]
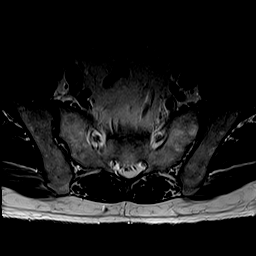
[im 6/42]
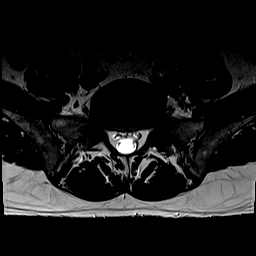
[im 12/42]
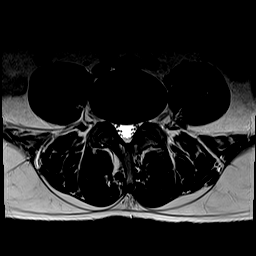
[im 18/42]
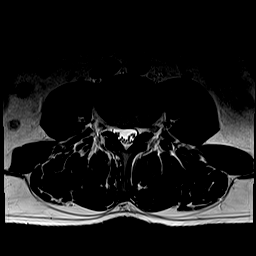
[im 21/42]
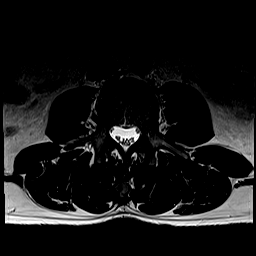
[im 24/42]
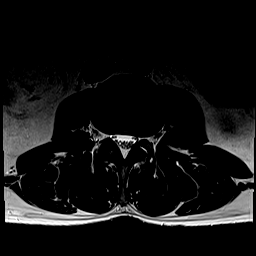
[im 30/42]
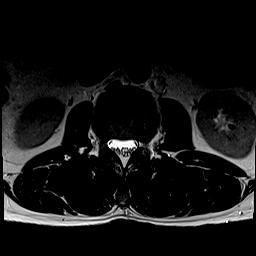
[im 36/42]
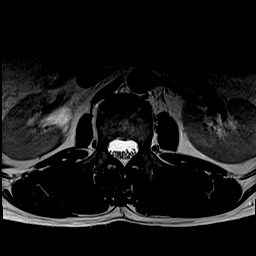
[im 42/42]
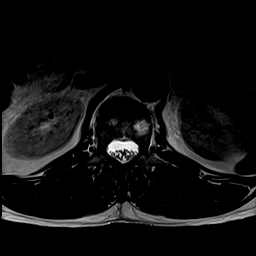

[Series 9: T1 · axial · 4.0mm · 0.39mm/px · z∈[-181,+58]mm · 9 of 42 slices shown (2 of 2)]
[im 1/42]
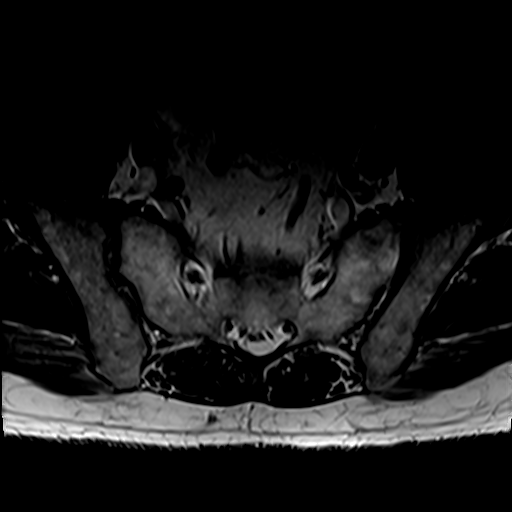
[im 6/42]
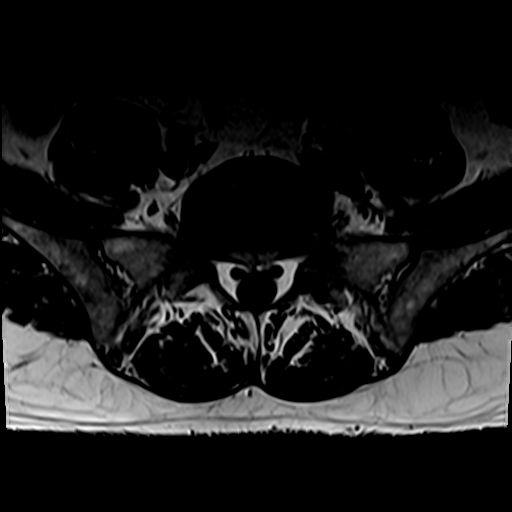
[im 12/42]
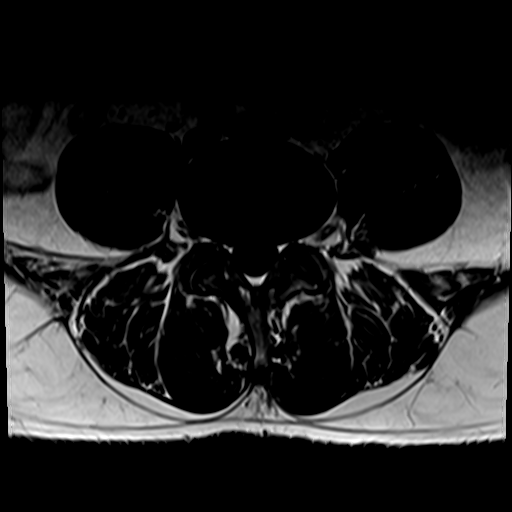
[im 18/42]
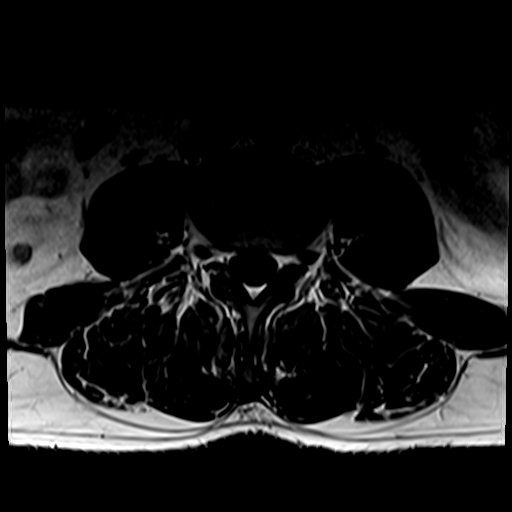
[im 21/42]
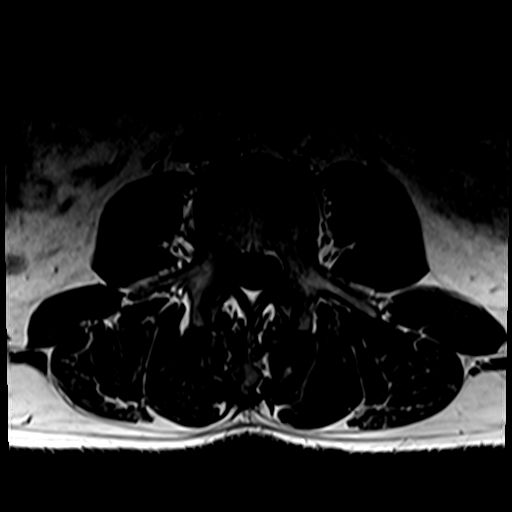
[im 24/42]
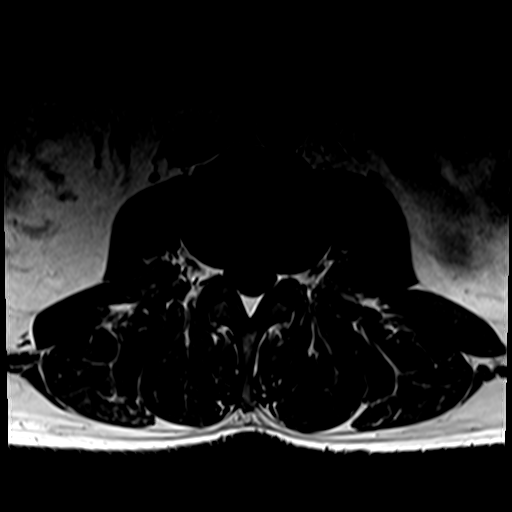
[im 30/42]
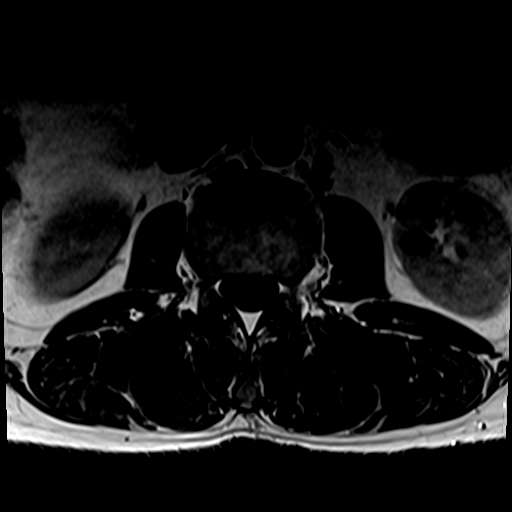
[im 36/42]
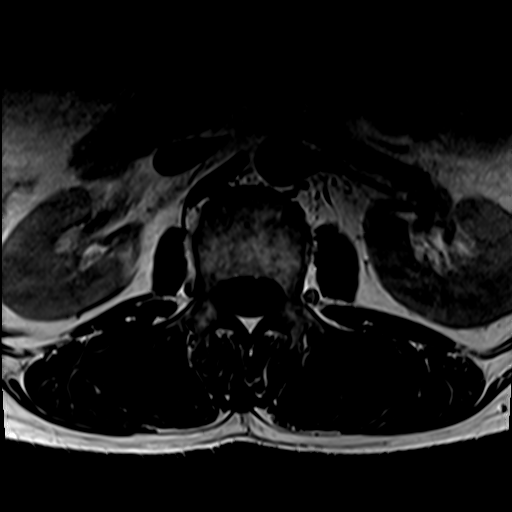
[im 42/42]
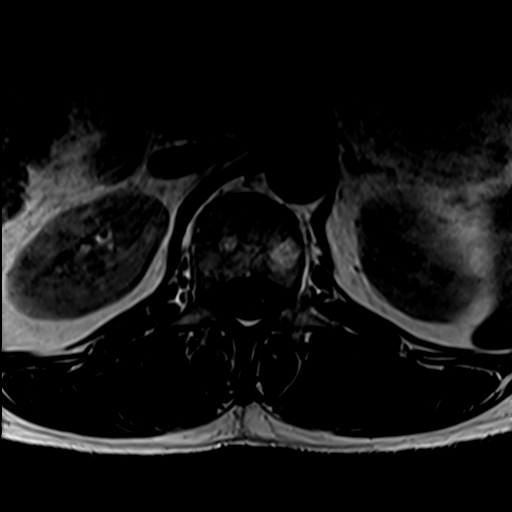

[31 of 48 positions shown; findings below may reference images not displayed]

FINDINGS: Segmentation:  Standard.

Alignment:  Normal.

Vertebrae: Small hemangiomas in T12 and L4 noted. No fracture or
worrisome lesion.

Conus medullaris and cauda equina: Conus extends to the T12 level.
Conus and cauda equina appear normal.

Paraspinal and other soft tissues: Negative.

Disc levels:

T12-L1: Negative.

L1-2: Very shallow right paracentral protrusion.  No stenosis.

L2-3: Mild-to-moderate facet degenerative change. Shallow disc bulge
with a small annular fissure. No stenosis.

L3-4: Shallow broad-based right paracentral protrusion without
stenosis.

L4-5: Shallow broad-based disc bulge and mild-to-moderate facet
degenerative change. No stenosis.

L5-S1: Shallow broad-based right paracentral protrusion without
stenosis.
IMPRESSION: Mild multilevel degenerative disc disease without central canal or
foraminal stenosis as described above.
